# Patient Record
Sex: Male | Born: 2000 | Race: Black or African American | Hispanic: No | Marital: Single | Smoking: Never smoker
Health system: Southern US, Community
[De-identification: ages and names within clinical notes are randomized; demographics above are authoritative.]

---
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
## 2018-11-25 ENCOUNTER — Other Ambulatory Visit: Payer: Self-pay

## 2018-11-25 ENCOUNTER — Ambulatory Visit: Payer: Self-pay | Admitting: Student in an Organized Health Care Education/Training Program

## 2018-11-25 ENCOUNTER — Ambulatory Visit (INDEPENDENT_AMBULATORY_CARE_PROVIDER_SITE_OTHER): Payer: BC Managed Care – PPO | Admitting: Family Medicine

## 2018-11-25 VITALS — BP 101/70 | HR 69 | Ht 65.95 in | Wt 160.0 lb

## 2018-11-25 DIAGNOSIS — Z7689 Persons encountering health services in other specified circumstances: Secondary | ICD-10-CM | POA: Diagnosis not present

## 2018-11-25 NOTE — Progress Notes (Signed)
Subjective:    Patient ID: Robert Lin, male    DOB: [birth year], 18 y.o.   MRN: [id number]   CC: New patient  HPI: [name] is a 18 year old male presenting to establish care and discuss the following:  Hand cramping: Bilateral, for the past year.  Off and on since that time.  States his hands will cramp up sometimes and he has to shake them out.  Not painful.  Feels like maybe they have been tingly, but states "not really."  Denies any associated upper extremity/hand grip weakness, numbness.  Works for [employer], is on the computer and types all day/every day.  Noticed after starting his job.  Does have a wrist pad in front of his keyboard, however still leans back his wrist often for typing.  Not waking up at night due to this.  Programming/cyber security. Paying for college, [employer] wants to [place].   Past medical history: - Previous scaphoid fracture back in 2018, resolved  Social history: Lives with mom, dad, 4 siblings, grandma. Likes to [employer], skateboard, workout.  Works out approximately 4 times a week, currently trying to "bulk up ".  Non-smoker, does not drink.  Denies any illicit drug use.  Has no concern for STDs. 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  They will be paying for his college, currently goes to [employer] for computer science and wants to transfer to [place].  Family history: No known family history of early CAD, sudden death. Mother has "slight form of schizophrenia or bipolar". Denies any thoughts of feeling depressed/down, inability to focus, anxiety or worry, flight of ideas, grandiosity, auditory/visual hallucinations.  Surgical history: None  Health maintenance: Due for flu shot--declines this today.  Medications: None.  Smoking status reviewed  Review of Systems Per HPI, in addition denies any chest pains, shortness of breath, abdominal pain, unexpected weight loss/weight gain, palpitations,  weakness/numbness   Objective:  BP 101/70   Pulse 69   Ht 5' 5.95" (1.675 m)   Wt 160 lb (72.6 kg)   SpO2 98%   BMI 25.87 kg/m  Vitals and nursing note reviewed  General: NAD, pleasant HEENT: Mucous membranes moist, EOMI, neck supple without any cervical lymphadenopathy Cardiac: RRR, normal heart sounds, no murmurs Respiratory: CTAB, normal effort Abdomen: soft, nontender, nondistended Extremities: no edema or cyanosis.  No tenderness to palpation of bilateral wrist joint and hands, no deformities noted.  Negative Phalen's and Tinel's test at bilateral wrists.  Full 5/5 upper extremity strength bilaterally, sensation to light touch intact throughout upper extremity into all distal fingertips. Skin: warm and dry, no rashes noted Neuro: alert and oriented, no focal deficits Psych: normal affect  Assessment & Plan:   Encounter to establish care with new doctor Discussed past medical, social, and family history with the patient.  Additionally reviewed health maintenance.  Due for flu shot today, however patient declines.  Congratulated patient on maintaining frequent physical activity and proper nutrition.  Bilateral hand cramping:  Suspect mild bilateral carpal tunnel in the setting of frequent typing, however may also just be cramping in the setting of repetitive motion. Discussed appropriate hand posture with wrist pad, taking breaks from typing ever so often, and night wrist splinting/brace.  However, patient would like to modify his hand positioning prior to trying braces.  He will follow-up if his symptoms are not improving within the next month or sooner if worsening.   Follow-up if symptoms above or not improving or sooner if worsening, otherwise follow-up in  1 year for annual physical.  [facility] Resident PGY-2

## 2018-11-25 NOTE — Patient Instructions (Signed)
Wonderful to meet you today.  For your hands, please try to fix your posture while typing and give yourself a break every so often.  If it is not improving with this, please follow-up within the next few months we can discuss trying wrist brace.   Otherwise, I would like to see you back in approximately 1 year for annual visit, or sooner if needed.

## 2018-11-26 ENCOUNTER — Encounter: Payer: Self-pay | Admitting: Family Medicine

## 2018-11-26 DIAGNOSIS — Z7689 Persons encountering health services in other specified circumstances: Secondary | ICD-10-CM | POA: Insufficient documentation

## 2018-11-26 NOTE — Assessment & Plan Note (Signed)
Discussed past medical, social, and family history with the patient.  Additionally reviewed health maintenance.  Due for flu shot today, however patient declines.  Congratulated patient on maintaining frequent physical activity and proper nutrition.
